# Patient Record
Sex: Female | Born: 1965
Health system: Southern US, Community
[De-identification: ages and names within clinical notes are randomized; demographics above are authoritative.]

## PROBLEM LIST (undated history)

## (undated) DIAGNOSIS — D649 Anemia, unspecified: Secondary | ICD-10-CM

## (undated) DIAGNOSIS — E079 Disorder of thyroid, unspecified: Secondary | ICD-10-CM

## (undated) HISTORY — DX: Anemia, unspecified: D64.9

## (undated) HISTORY — DX: Disorder of thyroid, unspecified: E07.9

---

## 2018-08-25 ENCOUNTER — Ambulatory Visit: Payer: BLUE CROSS/BLUE SHIELD | Admitting: Internal Medicine

## 2018-08-25 ENCOUNTER — Other Ambulatory Visit: Payer: Self-pay

## 2018-08-25 ENCOUNTER — Encounter: Payer: Self-pay | Admitting: Internal Medicine

## 2018-08-25 VITALS — BP 126/78 | HR 88 | Temp 97.9°F | Ht 60.0 in | Wt 155.0 lb

## 2018-08-25 DIAGNOSIS — R7989 Other specified abnormal findings of blood chemistry: Secondary | ICD-10-CM | POA: Diagnosis not present

## 2018-08-25 DIAGNOSIS — E0590 Thyrotoxicosis, unspecified without thyrotoxic crisis or storm: Secondary | ICD-10-CM | POA: Diagnosis not present

## 2018-08-25 LAB — T4, FREE: Free T4: 0.56 ng/dL — ABNORMAL LOW (ref 0.60–1.60)

## 2018-08-25 LAB — TSH: TSH: 1.3 u[IU]/mL (ref 0.35–4.50)

## 2018-08-25 NOTE — Patient Instructions (Signed)
-   We recommend that you follow these hyperthyroidism instructions at home:  1) Take Methimazole   If you develop severe sore throat with high fevers OR develop unexplained yellowing of your skin, eyes, under your tongue, severe abdominal pain with nausea or vomiting --> then please get evaluated immediately.  3) Get repeat thyroid labs 4 weeks .   It is ESSENTIAL to get follow-up labs to help avoid over or undertreatment of your hyperthyroidism - both of which can be dangerous to your health.

## 2018-08-25 NOTE — Progress Notes (Signed)
Name: Loretta Garner  MRN/ DOB: [id number], 1966-02-15    Age/ Sex: 53 y.o., female    PCP: Loretta Pandy, MD   Reason for Endocrinology Evaluation: Low TSH      Date of Initial Endocrinology Evaluation: 08/25/2018     HPI: Ms. Loretta Garner is a 53 y.o. female with mild OSA and chronic sinus issues. The patient presented for initial endocrinology clinic visit on 08/25/2018 for consultative assistance with her Low TSH .   The patient presented to her PCP on 07/28/2018 with c/o acute onset SOB of 2 months duration. Her symptoms were associated with fatigue  Today she continues to have fatigue, as well as weight gain, but  No diarrhea. She has occasional palpitations, jittery sensation and anxiety, with right arm tremors.    Was on Biotin but stopped it > week ago  Does not smoke.   She denies local neck symptoms.   Denies recent viral infections. She does recall having anterior neck pain a couple of weeks ago.   Has chronic sinus issues.   Denies eye itching or foreign sensation .   She has irregular periods.   Sister with hypothyroidism   HISTORY:  Past Medical History:  Past Medical History:  Diagnosis Date  . Anemia   . Thyroid disease     Past Surgical History: n/a  Social History:  reports that she has never smoked. She has never used smokeless tobacco. She reports that she does not drink alcohol or use drugs.  Family History: family history includes Hypothyroidism in her sister.   HOME MEDICATIONS: Allergies as of 08/25/2018   No Known Allergies     Medication List       Accurate as of August 25, 2018  8:59 AM. Always use your most recent med list.        meloxicam 7.5 MG tablet Commonly known as:  MOBIC   Vitamin D (Ergocalciferol) 1.25 MG (50000 UT) Caps capsule Commonly known as:  DRISDOL         REVIEW OF SYSTEMS: A comprehensive ROS was conducted with the patient and is negative except as per HPI and below:  Review of Systems   Constitutional: Positive for malaise/fatigue. Negative for weight loss.  HENT: Positive for congestion. Negative for sore throat.   Respiratory: Positive for shortness of breath. Negative for cough.   Cardiovascular: Positive for palpitations. Negative for chest pain.  Gastrointestinal: Negative for diarrhea and nausea.  Genitourinary: Positive for frequency.  Neurological: Positive for tremors. Negative for tingling.  Endo/Heme/Allergies: Positive for polydipsia.  Psychiatric/Behavioral: The patient is nervous/anxious.        OBJECTIVE:  VS: BP 126/78 (BP Location: Right Arm, Patient Position: Sitting, Cuff Size: Normal)   Pulse 88   Temp 97.9 F (36.6 C)   Ht 5' (1.524 m)   Wt 155 lb (70.3 kg)   SpO2 99%   BMI 30.27 kg/m    Wt Readings from Last 3 Encounters:  08/25/18 155 lb (70.3 kg)     EXAM: General: Pt appears well and is in NAD  Hydration: Well-hydrated with moist mucous membranes and good skin turgor  Eyes: External eye exam normal without stare, lid lag or exophthalmos.  EOM intact.  PERRL.  Ears, Nose, Throat: Hearing: Grossly intact bilaterally Dental: Good dentition  Throat: Clear without mass, erythema or exudate  Neck: General: Supple without adenopathy. Thyroid: Thyroid size normal.  No goiter or nodules appreciated. No thyroid bruit.  Lungs: Clear  with good BS bilat with no rales, rhonchi, or wheezes  Heart: Auscultation: RRR.  Abdomen: Normoactive bowel sounds, soft, nontender, without masses or organomegaly palpable  Extremities: Gait and station: Normal gait  Digits and nails: No clubbing, cyanosis, petechiae, or nodes Head and neck: Normal alignment and mobility BL UE: Normal ROM and strength. BL LE: No pretibial edema normal ROM and strength.  Skin: Hair: Texture and amount normal with gender appropriate distribution Skin Inspection: No rashes, acanthosis nigricans/skin tags. No lipohypertrophy Skin Palpation: Skin temperature, texture, and  thickness normal to palpation  Neuro: Cranial nerves: II - XII grossly intact  Cerebellar: Normal coordination and movement; no tremor Motor: Normal strength throughout DTRs: 2+ and symmetric in UE without delay in relaxation phase  Mental Status: Judgment, insight: Intact Orientation: Oriented to time, place, and person Memory: Intact for recent and remote events Mood and affect: No depression, anxiety, or agitation     DATA REVIEWED: 07/29/2018  TSh 0.045 uIU/mL  T3 236 ng/dL ( [id number])  T4 8.8 ug/dL ( 6.0-45.44.5-12.0)     WBC 6.3  H/H 10.6/33.0 Plt 339 Neut 63 % Lymph 30% Monocytes 6% Eos 1%   Thyroid ultrasound 07/31/2018  Right lower central nodule, circumscribed, mostly cystic, anechoic with isoechoic echogenicity along the superior margin, measures 1.4 cm  ASSESSMENT/PLAN/RECOMMENDATIONS:   1. Hyperthyroidism :  - We discussed D/D of autonomous nodule, graves' disease vs thyroiditis.  - Will obtain TRAb levels today -  - We discussed with pt the benefits of methimazole in the Tx of hyperthyroidism, as well as the possible side effects/complications of anti-thyroid drug Tx (specifically detailing the rare, but serious side effect of agranulocytosis). She was informed of need for regular thyroid function monitoring while on methimazole to ensure appropriate dosage without over-treatment. As well, we discussed the possible side effects of methimazole including the chance of rash, the small chance of liver irritation/juandice and the <=1 in 300-400 chance of sudden onset agranulocytosis.  We discussed importance of going to ED promptly (and stopping methimazole) if shewere to develop significant fever with severe sore throat of other evidence of acute infection.     We extensively discussed the various treatment options for hyperthyroidism including ablation therapy with radioactive iodine versus antithyroid drug treatment versus surgical therapy.   I carefully explained to the  patient that one of the consequences of I-131 ablation treatment would likely be permanent hypothyroidism which would require long-term replacement therapy with LT4.     F/u in 8 weeks Labs in 4 weeks    2. Right cystic Nodule :  - Most likely colloid nodule. Unfortunately the images are not available to me at this time. Will monitor clinically and have a  Low threshold for repeat ultrasound in the future.   Addendum: Repeat TFT's show normal TSH and T3 but with low FT4. I will not start her on anything at this time. Its difficult to say what caused the previous picture of hyperthyroidism in the past , whether it was a biotin effect, or if she has thyroiditis and now she is in the recovery phase of it. Will repeat labs in 4 weeks and proceed from there.  Left a message for a call back on 08/26/2018 . If no response in 24-hrs , will send a letter in the mail.    Signed electronically by: Lyndle HerrlichAbby Jaralla Kyasia Steuck, MD  [facility]  [facility] [address] [place], [place] [address] Phone: [phone] FAX: [phone]  CC: Sasser, Clarene Critchley, MD [address] [place] [address] Phone: [phone] Fax: [phone]   Return to Endocrinology clinic as below: Future Appointments  Date Time Provider Department Center  09/22/2018  2:30 PM [facility] None  10/20/2018  7:50 AM Uvaldo Rybacki, Konrad Dolores, MD [facility] None

## 2018-08-26 ENCOUNTER — Telehealth: Payer: Self-pay | Admitting: Internal Medicine

## 2018-08-26 DIAGNOSIS — R7989 Other specified abnormal findings of blood chemistry: Secondary | ICD-10-CM | POA: Insufficient documentation

## 2018-08-26 NOTE — Telephone Encounter (Signed)
Left a message for a call back on 08/26/2018 @ 8:05 AM     Abby Raelyn Mora, MD  [facility]  [facility] [address] [place], [place] [address] Phone: [phone] FAX: [phone]

## 2018-08-26 NOTE — Telephone Encounter (Signed)
Discussed normalization of TSH and T3 with the patient but low FT4.   No intervention at this time, will repeat labs again in 4 weeks     Pt expressed understanding.     Abby Raelyn Mora, MD  [facility]  [facility] [address] [place], [place] [address] Phone: [phone] FAX: [phone]

## 2018-08-29 LAB — TRAB (TSH RECEPTOR BINDING ANTIBODY): TRAB: <1 IU/L (ref <=2.00)

## 2018-08-29 LAB — T3: T3, Total: 129 ng/dL (ref 76–181)

## 2018-09-22 ENCOUNTER — Other Ambulatory Visit: Payer: BLUE CROSS/BLUE SHIELD

## 2018-09-24 ENCOUNTER — Other Ambulatory Visit: Payer: Self-pay | Admitting: Internal Medicine

## 2018-09-24 ENCOUNTER — Other Ambulatory Visit: Payer: Self-pay

## 2018-09-24 ENCOUNTER — Other Ambulatory Visit (INDEPENDENT_AMBULATORY_CARE_PROVIDER_SITE_OTHER): Payer: BLUE CROSS/BLUE SHIELD

## 2018-09-24 DIAGNOSIS — E0590 Thyrotoxicosis, unspecified without thyrotoxic crisis or storm: Secondary | ICD-10-CM

## 2018-09-24 LAB — TSH: TSH: 1.18 u[IU]/mL (ref 0.35–4.50)

## 2018-09-24 LAB — T4, FREE: Free T4: 0.64 ng/dL (ref 0.60–1.60)

## 2018-09-25 ENCOUNTER — Encounter: Payer: Self-pay | Admitting: Internal Medicine

## 2018-09-25 LAB — T3: T3, Total: 107 ng/dL (ref 76–181)

## 2018-10-20 ENCOUNTER — Ambulatory Visit: Payer: BLUE CROSS/BLUE SHIELD | Admitting: Internal Medicine

## 2018-10-22 ENCOUNTER — Encounter: Payer: Self-pay | Admitting: Internal Medicine

## 2018-10-22 ENCOUNTER — Ambulatory Visit: Payer: BC Managed Care – PPO | Admitting: Internal Medicine

## 2018-10-22 ENCOUNTER — Other Ambulatory Visit: Payer: Self-pay

## 2018-10-22 VITALS — BP 138/84 | HR 55 | Temp 97.9°F | Ht 60.0 in | Wt 150.8 lb

## 2018-10-22 DIAGNOSIS — E041 Nontoxic single thyroid nodule: Secondary | ICD-10-CM

## 2018-10-22 DIAGNOSIS — E0590 Thyrotoxicosis, unspecified without thyrotoxic crisis or storm: Secondary | ICD-10-CM

## 2018-10-22 LAB — TSH: TSH: 1.03 u[IU]/mL (ref 0.35–4.50)

## 2018-10-22 LAB — T4, FREE: Free T4: 0.78 ng/dL (ref 0.60–1.60)

## 2018-10-22 NOTE — Progress Notes (Signed)
Name: Loretta DenverJudith Day Garner  MRN/ DOB: [id number], Mar 29, 1966    Age/ Sex: 53 y.o., female     PCP: Estanislado PandySasser, Paul W, MD   Reason for Endocrinology Evaluation: Low TSH      Initial Endocrinology Clinic Visit: 08/25/2018    PATIENT IDENTIFIER: Loretta Garner is a 53 y.o., female with a past medical history of mild OSA and chronic sinus issues. She has followed with [facility] since 08/25/2018 for consultative assistance with management of her low TSH .   HISTORICAL SUMMARY:  The patient presented to her PCP on 07/28/2018 with c/o acute onset of SOB of 2 months duration associated with fatigue. He was noted to have a low TSH at 0.045 uIU/mL with normal T4 at 8.8 ug/dL but elevated T3 at 621236 ng/dL. She was on biotin at the time.  A thyroid ultrasound on 07/31/2018 revealed a 1.4 cm nodule , mostly cystic, anechoic with isoechoic echogenicity at the right lobe .   Repeat TFT's in 08/2018 showed normalization of the TSH and T3 but low FT4.   SUBJECTIVE:   During last visit (08/25/2018): normalization of the TSh and T3, with low FT4. No intervention provided at the time.   Today (10/22/2018):  Loretta Garner is here for a 2 month follow up on hyperthyroidism. She has lost 5 lbs but this has been intentional. Palpitations are improving, no anxiety or jittery sensation . She has felt tired but she attributes this to extra work hours.  No sob.   No Biotin   She does recall tenderness on the right side a few months.  Denies local neck symptoms.    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   HISTORY:  Past Medical History:  Past Medical History:  Diagnosis Date  . Anemia   . Thyroid disease     Past Surgical History: History reviewed. No pertinent surgical history.  Social History:  reports that she has never smoked. She has never used smokeless tobacco. She reports that she does not drink alcohol or use drugs. Family History:  Family History  Problem Relation Age  of Onset  . Hypothyroidism Sister      HOME MEDICATIONS: Allergies as of 10/22/2018   No Known Allergies     Medication List       Accurate as of October 22, 2018  1:07 PM. If you have any questions, ask your nurse or doctor.        meloxicam 7.5 MG tablet Commonly known as: MOBIC   Vitamin D (Ergocalciferol) 1.25 MG (50000 UT) Caps capsule Commonly known as: DRISDOL         OBJECTIVE:   PHYSICAL EXAM: VS: BP 138/84 (BP Location: Left Arm, Patient Position: Sitting, Cuff Size: Normal)   Pulse (!) 55   Temp 97.9 F (36.6 C)   Ht 5' (1.524 m)   Wt 150 lb 12.8 oz (68.4 kg)   SpO2 97%   BMI 29.45 kg/m    EXAM: General: Pt appears well and is in NAD  Neck: General: Supple without adenopathy. Thyroid: Thyroid size normal.  No goiter or nodules appreciated. No thyroid bruit.  Lungs: Clear with good BS bilat with no rales, rhonchi, or wheezes  Heart: Auscultation: RRR.  Abdomen: Normoactive bowel sounds, soft, nontender, without masses or organomegaly palpable  Extremities:  BL LE: No pretibial edema normal ROM and strength.  Skin: Hair: Texture and amount normal with gender appropriate distribution Skin Inspection: No rashes. Skin  Palpation: Skin temperature, texture, and thickness normal to palpation  Mental Status: Judgment, insight: Intact Orientation: Oriented to time, place, and person Mood and affect: No depression, anxiety, or agitation     DATA REVIEWED: Results for Loretta Garner, Loretta Garner (MRN [id number]) as of 10/22/2018 13:06  Ref. Range 08/25/2018 08:58 09/24/2018 08:34 10/22/2018 09:02  TSH Latest Ref Range: 0.35 - 4.50 uIU/mL 1.30 1.18 1.03  Triiodothyronine (T3) Latest Ref Range: 76 - 181 ng/dL 129 107   T4,Free(Direct) Latest Ref Range: 0.60 - 1.60 ng/dL 0.56 (L) 0.64 0.78    Results for Loretta Garner, Loretta Garner (MRN [id number]) as of 10/22/2018 08:38  Ref. Range 08/25/2018 08:58  TRAB Latest Ref Range: <=2.00 IU/L <1.00    ASSESSMENT / PLAN / RECOMMENDATIONS:    1. Hx of hyperthyroidism :   - Clinically she is euthyroid - No local neck symptoms.  - Repeat TFT's continue to be normal.  - We again discussed this could be lab interference due to biotin vs recovered thyroiditis.   2. Right thyroid Lobe Nodule :   - No local neck symptoms - Will repeat ultrasound by March, 2021    F/u in 9 months   Signed electronically by: Mack Guise, MD  [facility]  [facility] [address] [place], [place] [address] Phone: [phone] FAX: [phone]      CC: Manon Hilding, MD [place] [address] Phone: [phone]  Fax: [phone]   Return to Endocrinology clinic as below: Future Appointments  Date Time Provider [place]  07/22/2019  8:30 AM Mahir Prabhakar, Melanie Crazier, MD [facility] None

## 2019-07-20 ENCOUNTER — Other Ambulatory Visit: Payer: Self-pay

## 2019-07-22 ENCOUNTER — Ambulatory Visit: Payer: BC Managed Care – PPO | Admitting: Internal Medicine

## 2019-08-18 DIAGNOSIS — I10 Essential (primary) hypertension: Secondary | ICD-10-CM | POA: Diagnosis not present

## 2019-09-14 NOTE — Progress Notes (Signed)
Name: Jennife Zaucha  MRN/ DOB: [id number], [birth year]    Age/ Sex: 54 y.o., female     PCP: Estanislado Pandy, MD   Reason for Endocrinology Evaluation: Low TSH      Initial Endocrinology Clinic Visit: 08/25/2018    PATIENT IDENTIFIER: Ms. Henli Day Laughery is a 54 y.o., female with a past medical history of mild OSA and chronic sinus issues. She has followed with [facility] since 08/25/2018 for consultative assistance with management of her low TSH .   HISTORICAL SUMMARY:  The patient presented to her PCP on 07/28/2018 with c/o acute onset of SOB of 2 months duration associated with fatigue. She was noted to have a low TSH at 0.045 uIU/mL with normal T4 at 8.8 ug/dL but elevated T3 at 182 ng/dL. She was on biotin at the time.  A thyroid ultrasound on 07/31/2018 revealed a 1.4 cm nodule , mostly cystic, anechoic with isoechoic echogenicity at the right lobe .   Repeat TFT's in 08/2018 showed normalization of the TSH and T3 but low FT4.   SUBJECTIVE:   During last visit (10/22/2018): Normal TFT's  Today (09/15/2019):  Ms. Soulliere is here for a follow up on hyperthyroidism.  Weight has been stable  Energy level is stable  Has occasional palpitations - diltiazem has helped Has occasional anxiety  Denies local neck symptoms.   Went to urgent care in 07/2019 for hypertensive urgency , was treated with IV labetalol . 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   HISTORY:  Past Medical History:  Past Medical History:  Diagnosis Date  . Anemia   . Thyroid disease     Past Surgical History: No past surgical history on file.  Social History:  reports that she has never smoked. She has never used smokeless tobacco. She reports that she does not drink alcohol or use drugs. Family History:  Family History  Problem Relation Age of Onset  . Hypothyroidism Sister      HOME MEDICATIONS: Allergies as of 09/15/2019   No Known Allergies     Medication List       Accurate as of Sep 15, 2019  8:57 AM. If you have any questions, ask your nurse or doctor.        Matzim LA 180 MG 24 hr tablet Generic drug: diltiazem Take 180 mg by mouth daily.   meloxicam 7.5 MG tablet Commonly known as: MOBIC   Vitamin D (Ergocalciferol) 1.25 MG (50000 UNIT) Caps capsule Commonly known as: DRISDOL         OBJECTIVE:   PHYSICAL EXAM: VS: BP 120/82 (BP Location: Left Arm, Patient Position: Sitting, Cuff Size: Normal)   Pulse 64   Temp 98.4 F (36.9 C) (Oral)   Ht 4' 11.5" (1.511 m)   Wt 150 lb (68 kg)   LMP 08/30/2019 (Exact Date)   SpO2 99%   BMI 29.79 kg/m    EXAM: General: Pt appears well and is in NAD  Neck: General: Supple without adenopathy. Thyroid: Thyroid size normal.  No goiter or nodules appreciated. No thyroid bruit.  Lungs: Clear with good BS bilat with no rales, rhonchi, or wheezes  Heart: Auscultation: RRR.  Abdomen: Normoactive bowel sounds, soft, nontender, without masses or organomegaly palpable  Extremities:  BL LE: No pretibial edema normal ROM and strength.  Mental Status: Judgment, insight: Intact Orientation: Oriented to time, place, and person Mood and affect: No depression, anxiety,  or agitation     DATA REVIEWED:  Results for LOUVINIA, CUMBO (MRN [id number]) as of 09/15/2019 11:04  Ref. Range 10/22/2018 09:02  TSH Latest Ref Range: 0.35 - 4.50 uIU/mL 1.03  T4,Free(Direct) Latest Ref Range: 0.60 - 1.60 ng/dL 0.78    Results for CAYLEE, VLACHOS (MRN [id number]) as of 10/22/2018 08:38  Ref. Range 08/25/2018 08:58  TRAB Latest Ref Range: <=2.00 IU/L <1.00    ASSESSMENT / PLAN / RECOMMENDATIONS:   1. Hx of hyperthyroidism :   - Clinically she is euthyroid - No local neck symptoms.  - Most likely this was due to subacute thyroiditis - Pt was provided with printed lab orders to have TFT's drawn   2. Right thyroid Lobe Nodule :   - No local neck symptoms - An ultrasound order has  been placed to be performed here at [facility] , and will proceed from there      F/u in 1 yr   Signed electronically by: Mack Guise, MD  [facility]  [address] [place], [place] [address] Phone: [phone] FAX: [phone]      CC: Manon Hilding, MD [place] [address] Phone: [phone]  Fax: [phone]   Return to Endocrinology clinic as below: No future appointments.

## 2019-09-15 ENCOUNTER — Other Ambulatory Visit: Payer: Self-pay

## 2019-09-15 ENCOUNTER — Encounter: Payer: Self-pay | Admitting: Internal Medicine

## 2019-09-15 ENCOUNTER — Ambulatory Visit: Payer: BC Managed Care – PPO | Admitting: Internal Medicine

## 2019-09-15 VITALS — BP 120/82 | HR 64 | Temp 98.4°F | Ht 59.5 in | Wt 150.0 lb

## 2019-09-15 DIAGNOSIS — E041 Nontoxic single thyroid nodule: Secondary | ICD-10-CM

## 2019-09-16 ENCOUNTER — Other Ambulatory Visit: Payer: Self-pay | Admitting: Internal Medicine

## 2019-09-16 DIAGNOSIS — E041 Nontoxic single thyroid nodule: Secondary | ICD-10-CM | POA: Diagnosis not present

## 2019-09-20 LAB — TSH: TSH: 1.18 u[IU]/mL (ref 0.450–4.500)

## 2019-09-20 LAB — T4, FREE: Free T4: 0.84 ng/dL (ref 0.82–1.77)

## 2019-09-22 DIAGNOSIS — Z6829 Body mass index (BMI) 29.0-29.9, adult: Secondary | ICD-10-CM | POA: Diagnosis not present

## 2019-09-22 DIAGNOSIS — R350 Frequency of micturition: Secondary | ICD-10-CM | POA: Diagnosis not present

## 2019-09-23 ENCOUNTER — Encounter: Payer: Self-pay | Admitting: Internal Medicine

## 2019-11-03 ENCOUNTER — Ambulatory Visit
Admission: RE | Admit: 2019-11-03 | Discharge: 2019-11-03 | Disposition: A | Discharge status: Home / Self Care | Payer: BC Managed Care – PPO | Source: Ambulatory Visit | Attending: Internal Medicine | Admitting: Internal Medicine

## 2019-11-03 ENCOUNTER — Telehealth: Payer: Self-pay | Admitting: Internal Medicine

## 2019-11-03 DIAGNOSIS — E041 Nontoxic single thyroid nodule: Secondary | ICD-10-CM | POA: Diagnosis not present

## 2019-11-03 NOTE — Telephone Encounter (Signed)
Lft vm informing pt that she can request that code during her ultrasound today @ 1

## 2019-11-03 NOTE — Telephone Encounter (Signed)
Patient called stating she needs the CPT code for getting her ultrasound done. Patient ph# [phone] - appointment for scan is today at 11:00.

## 2019-11-05 ENCOUNTER — Encounter: Payer: Self-pay | Admitting: Internal Medicine

## 2020-07-17 IMAGING — US US THYROID
1 series · 13 of 25 positions shown · non-contrast
Comparison: None.

CLINICAL DATA: Thyroid nodule.

EXAM:
THYROID ULTRASOUND
TECHNIQUE: Ultrasound examination of the thyroid gland and adjacent soft
tissues was performed.

[Series 1: us thyroid · 0.05mm/px · 13 of 68 slices shown]
[im 1/68]
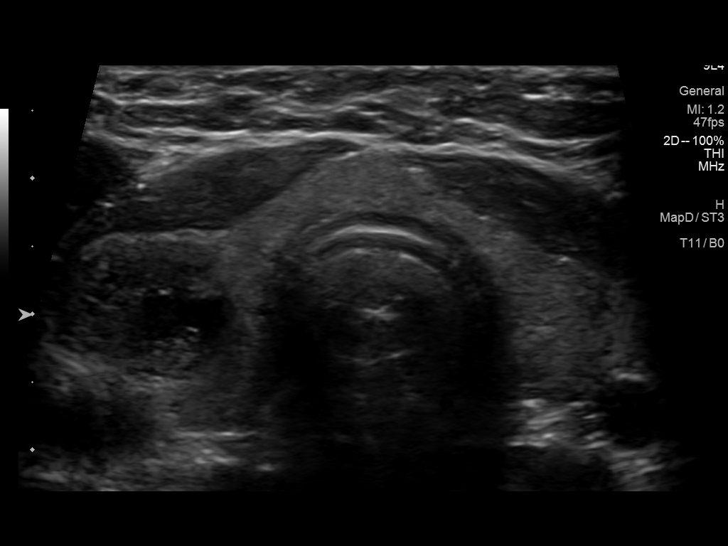
[im 6/68]
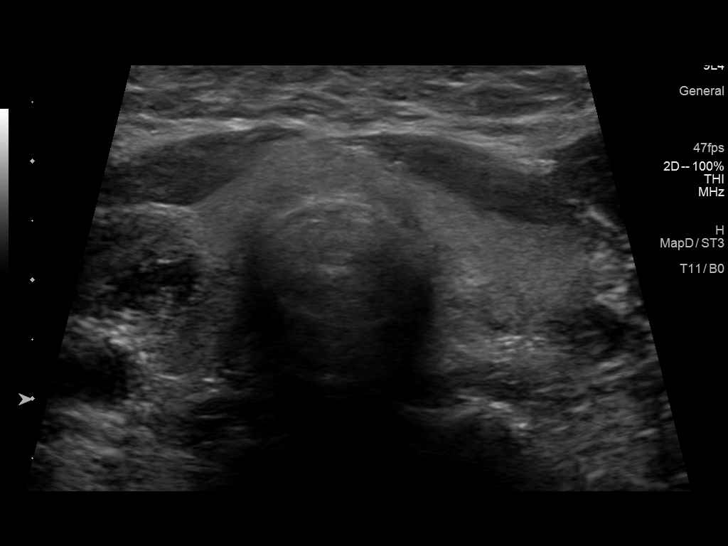
[im 12/68]
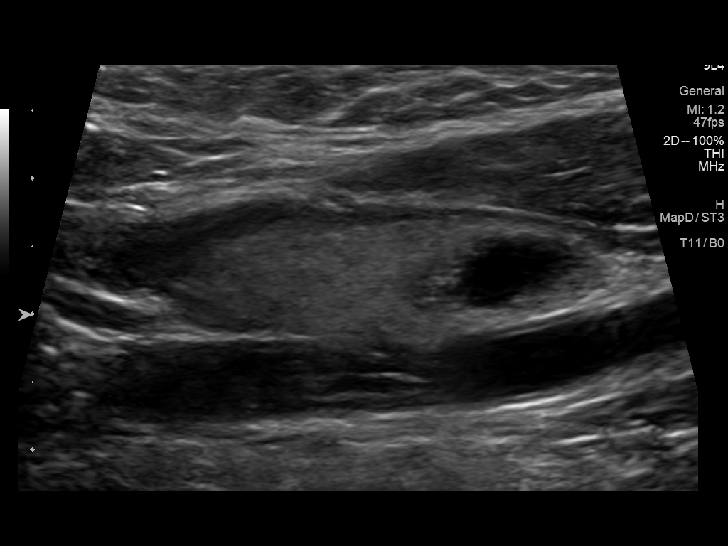
[im 17/68]
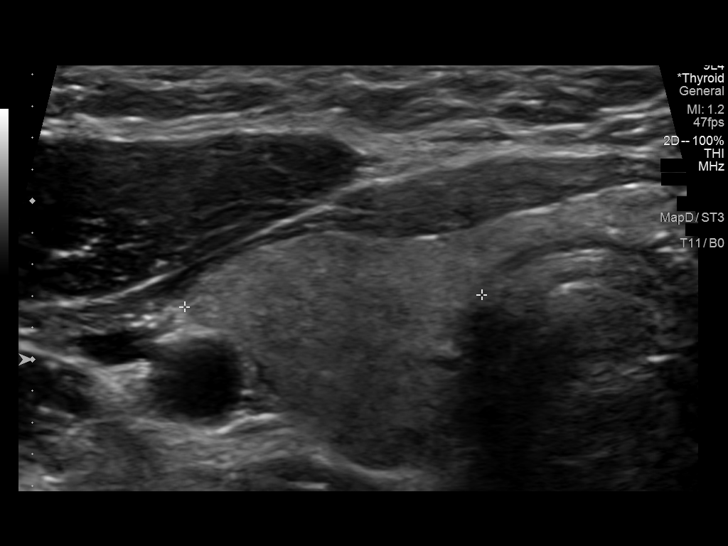
[im 23/68]
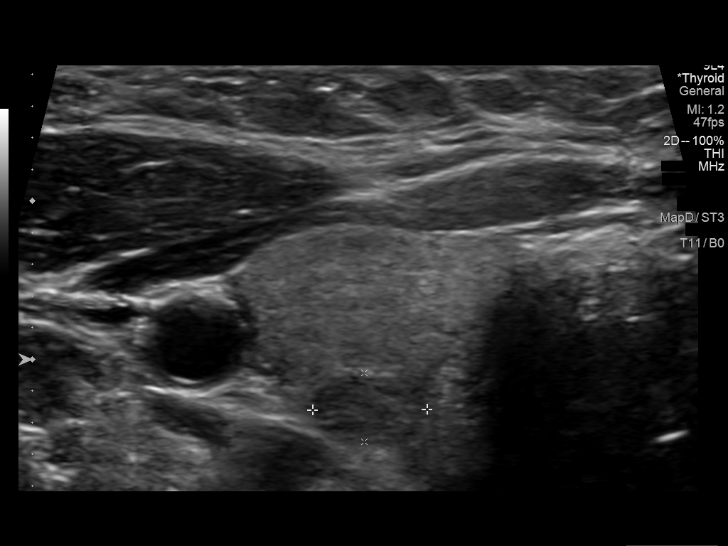
[im 28/68]
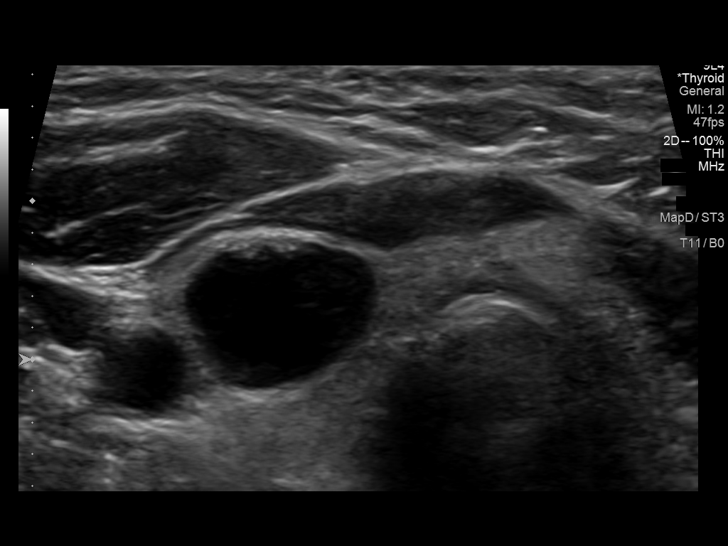
[im 34/68]
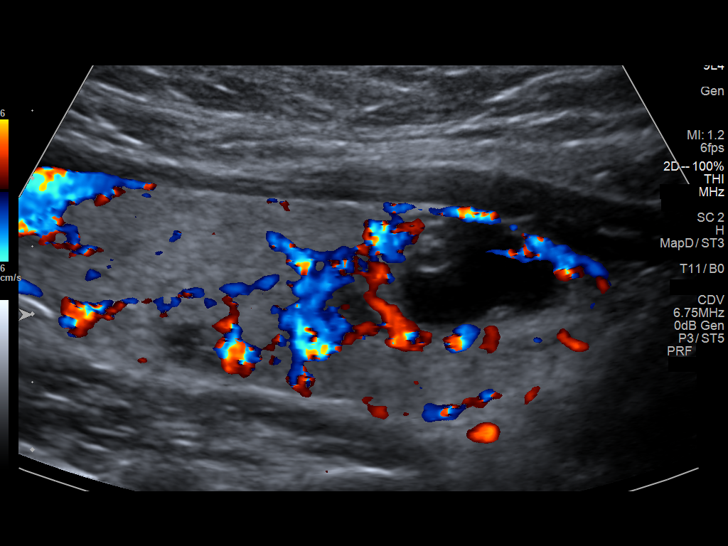
[im 40/68]
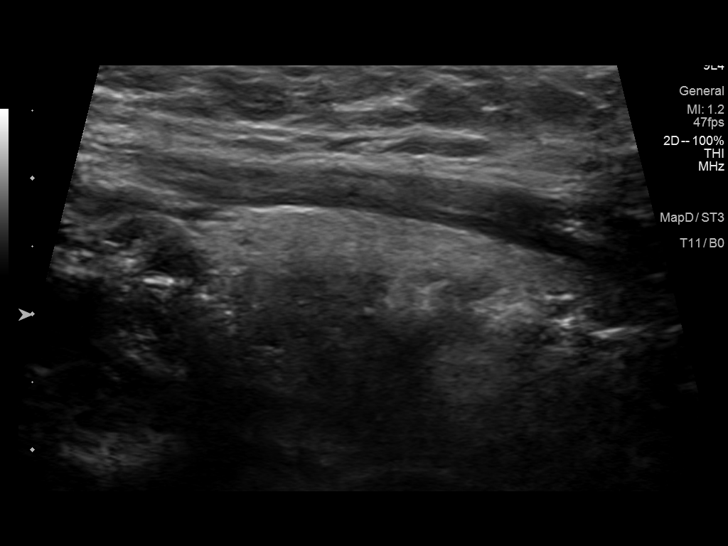
[im 45/68]
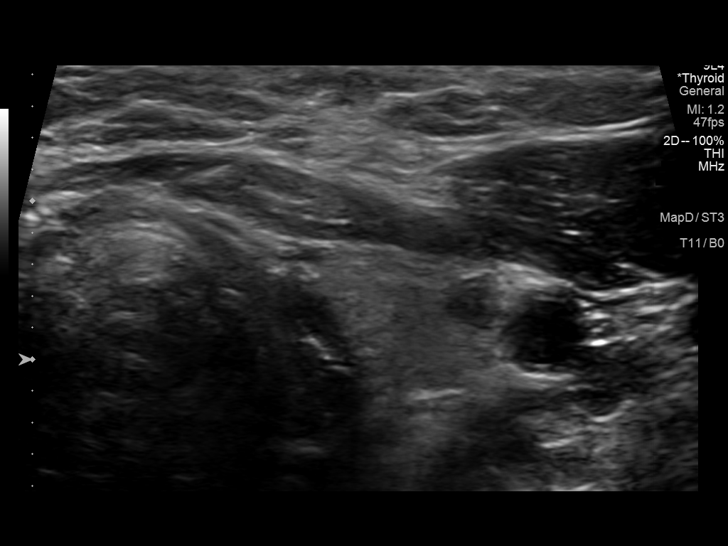
[im 51/68]
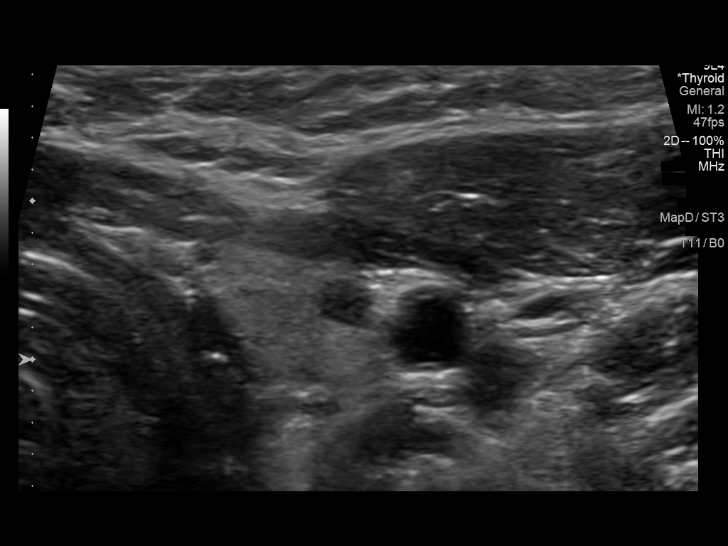
[im 56/68]
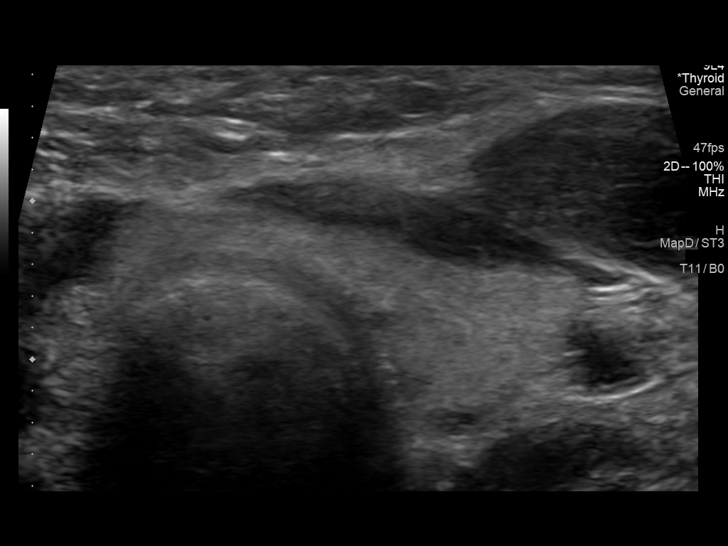
[im 62/68]
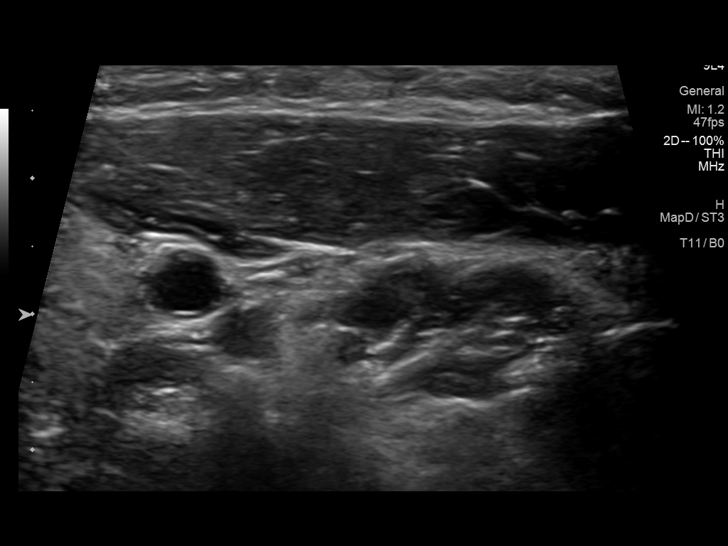
[im 68/68]
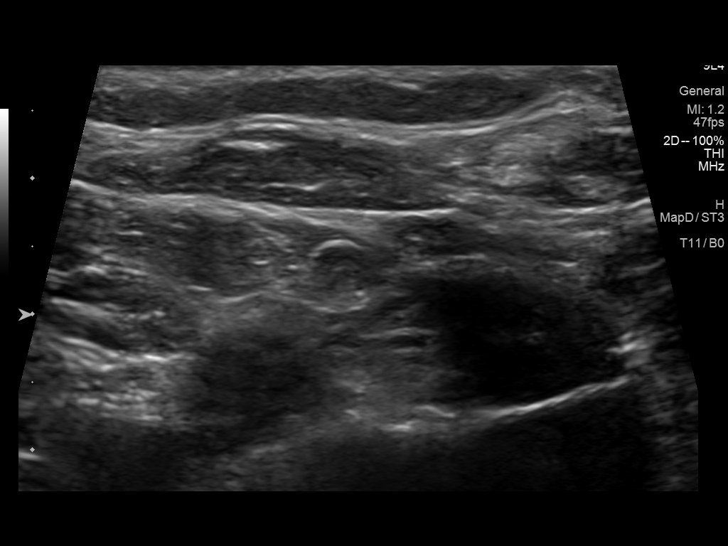

[13 of 25 positions shown; findings below may reference images not displayed]

FINDINGS: Parenchymal Echotexture: Mildly heterogenous

Isthmus: 0.5 cm

Right lobe: 4.7 x 1.6 x 1.9 cm

Left lobe: 4.4 x 1.3 x 1.5 cm

_________________________________________________________

Estimated total number of nodules >/= 1 cm: 2

Number of spongiform nodules >/=  2 cm not described below (TR1): 0

Number of mixed cystic and solid nodules >/= 1.5 cm not described
below (TR2): 0

_________________________________________________________

Nodule # 1:

Location: Right; Mid

Maximum size: 1.2 cm; Other 2 dimensions: 0.4 x 0.7 cm

Composition: cannot determine (2)

Echogenicity: hypoechoic (2)

Shape: not taller-than-wide (0)

Margins: smooth (0)

Echogenic foci: none (0)

ACR TI-RADS total points: 4.

ACR TI-RADS risk category: TR4 (4-6 points).

ACR TI-RADS recommendations:

*Given size (>/= 1 - 1.4 cm) and appearance, a follow-up ultrasound
in 1 year should be considered based on TI-RADS criteria.

_________________________________________________________

Nodule # 2:

Location: Right; Inferior

Maximum size: 1.9 cm; Other 2 dimensions: 1.1 x 1.4 cm

Composition: mixed cystic and solid (1)

Echogenicity: hypoechoic (2)

Shape: not taller-than-wide (0)

Margins: smooth (0)

Echogenic foci: none (0)

ACR TI-RADS total points: 3.

ACR TI-RADS risk category: TR3 (3 points).

ACR TI-RADS recommendations:

*Given size (>/= 1.5 - 2.4 cm) and appearance, a follow-up
ultrasound in 1 year should be considered based on TI-RADS criteria.

_________________________________________________________

Nodule 3 is a hypoechoic nodule in the superior left thyroid lobe
that measures up to 0.5 cm. This nodule does not meet criteria for
biopsy or dedicated follow-up.
IMPRESSION: 1. Bilateral thyroid nodules.
2. Nodule #1 and Nodule #2 in the right thyroid lobe both meet
criteria for 1 year follow-up.

The above is in keeping with the ACR TI-RADS recommendations - [HOSPITAL] 2864;[DATE].

## 2020-09-14 ENCOUNTER — Ambulatory Visit: Payer: BC Managed Care – PPO | Admitting: Internal Medicine
